# Patient Record
Sex: Male | Born: 1995 | Hispanic: Yes | Marital: Single | Smoking: Current some day smoker
Health system: Southern US, Community
[De-identification: ages and names within clinical notes are randomized; demographics above are authoritative.]

---

## 2018-12-19 ENCOUNTER — Emergency Department (HOSPITAL_COMMUNITY): Payer: Self-pay

## 2018-12-19 ENCOUNTER — Other Ambulatory Visit: Payer: Self-pay

## 2018-12-19 ENCOUNTER — Encounter (HOSPITAL_COMMUNITY): Payer: Self-pay | Admitting: Emergency Medicine

## 2018-12-19 ENCOUNTER — Emergency Department (HOSPITAL_COMMUNITY)
Admission: EM | Admit: 2018-12-19 | Discharge: 2018-12-19 | Disposition: A | Discharge status: Home / Self Care | Payer: Self-pay | Attending: Emergency Medicine | Admitting: Emergency Medicine

## 2018-12-19 DIAGNOSIS — J029 Acute pharyngitis, unspecified: Secondary | ICD-10-CM | POA: Insufficient documentation

## 2018-12-19 DIAGNOSIS — R509 Fever, unspecified: Secondary | ICD-10-CM | POA: Insufficient documentation

## 2018-12-19 DIAGNOSIS — B349 Viral infection, unspecified: Secondary | ICD-10-CM | POA: Insufficient documentation

## 2018-12-19 LAB — CBC WITH DIFFERENTIAL/PLATELET
Abs Immature Granulocytes: 0.03 10*3/uL (ref 0.00–0.07)
Basophils Absolute: 0 10*3/uL (ref 0.0–0.1)
Basophils Relative: 0 %
Eosinophils Absolute: 0 10*3/uL (ref 0.0–0.5)
Eosinophils Relative: 0 %
HCT: 43.2 % (ref 39.0–52.0)
Hemoglobin: 14.7 g/dL (ref 13.0–17.0)
Immature Granulocytes: 0 %
Lymphocytes Relative: 15 %
Lymphs Abs: 1.2 10*3/uL (ref 0.7–4.0)
MCH: 30.2 pg (ref 26.0–34.0)
MCHC: 34 g/dL (ref 30.0–36.0)
MCV: 88.7 fL (ref 80.0–100.0)
Monocytes Absolute: 1 10*3/uL (ref 0.1–1.0)
Monocytes Relative: 12 %
Neutro Abs: 6.1 10*3/uL (ref 1.7–7.7)
Neutrophils Relative %: 73 %
Platelets: 122 10*3/uL — ABNORMAL LOW (ref 150–400)
RBC: 4.87 MIL/uL (ref 4.22–5.81)
RDW: 12.4 % (ref 11.5–15.5)
WBC: 8.3 10*3/uL (ref 4.0–10.5)
nRBC: 0 % (ref 0.0–0.2)

## 2018-12-19 LAB — COMPREHENSIVE METABOLIC PANEL
ALT: 31 U/L (ref 0–44)
AST: 26 U/L (ref 15–41)
Albumin: 4 g/dL (ref 3.5–5.0)
Alkaline Phosphatase: 59 U/L (ref 38–126)
Anion gap: 13 (ref 5–15)
BUN: 8 mg/dL (ref 6–20)
CO2: 25 mmol/L (ref 22–32)
Calcium: 9.4 mg/dL (ref 8.9–10.3)
Chloride: 98 mmol/L (ref 98–111)
Creatinine, Ser: 1.12 mg/dL (ref 0.61–1.24)
GFR calc Af Amer: >60 mL/min (ref >60)
GFR calc non Af Amer: >60 mL/min (ref >60)
Glucose, Bld: 117 mg/dL — ABNORMAL HIGH (ref 70–99)
Potassium: 3.7 mmol/L (ref 3.5–5.1)
Sodium: 136 mmol/L (ref 135–145)
Total Bilirubin: 0.6 mg/dL (ref 0.3–1.2)
Total Protein: 7.6 g/dL (ref 6.5–8.1)

## 2018-12-19 LAB — URINALYSIS, ROUTINE W REFLEX MICROSCOPIC
Bilirubin Urine: NEGATIVE
Glucose, UA: NEGATIVE mg/dL
Hgb urine dipstick: NEGATIVE
Ketones, ur: NEGATIVE mg/dL
Leukocytes,Ua: NEGATIVE
Nitrite: NEGATIVE
Protein, ur: NEGATIVE mg/dL
Specific Gravity, Urine: 1.021 (ref 1.005–1.030)
pH: 7 (ref 5.0–8.0)

## 2018-12-19 LAB — GROUP A STREP BY PCR: Group A Strep by PCR: NOT DETECTED

## 2018-12-19 LAB — MONONUCLEOSIS SCREEN: Mono Screen: NEGATIVE

## 2018-12-19 MED ORDER — ACETAMINOPHEN 500 MG PO TABS
1000.0000 mg | ORAL_TABLET | Freq: Once | ORAL | Status: AC
  Administered 2018-12-19: 1000 mg via ORAL
  Filled 2018-12-19: qty 2

## 2018-12-19 NOTE — ED Provider Notes (Signed)
[facility]   CSN: [id number] Arrival date & time: 12/19/18  1701    History   Chief Complaint Chief Complaint  Patient presents with  . Fever  . Emesis    HPI Lorenza EvangelistDavid Tolentino-Solis is a 23 y.o. male.     23 year old healthy male who presents with fever.  Patient states that he has had 3 days of fevers at home associated with a sore throat.  He has been taking Tylenol, last dose was around 8 AM.  He denies any associated cough, runny nose, ear pain, vomiting, diarrhea, shortness of breath, sick contacts, abdominal pain, urinary symptoms, recent travel.  No body aches.  He does note that 3 days ago he had work done on his right great toe for an ingrown toenail.  He was given some medication to put on it to prevent infection.  He states that it has been healing okay at home.  The history is provided by the patient. A language interpreter was used.  Fever  Associated symptoms: vomiting   Emesis  Associated symptoms: fever     History reviewed. No pertinent past medical history.  There are no active problems to display for this patient.   ** The histories are not reviewed yet. Please review them in the "History" navigator section and refresh this SmartLink.    PMH: None  PSH: none  Home Medications    Prior to Admission medications   Not on File  none  Family History No family history on file.  Non-contributory  Social History Social History   Tobacco Use  . Smoking status: Current Some Day Smoker    Packs/day: 0.50    Years: 5.00    Pack years: 2.50    Types: Cigarettes  . Smokeless tobacco: Never Used  Substance Use Topics  . Alcohol use: Yes    Alcohol/week: 2.0 standard drinks    Types: 2 Cans of beer per week  . Drug use: Never   Denies IVDU  Allergies   Patient has no known allergies.   Review of Systems Review of Systems  Constitutional: Positive for fever.  Gastrointestinal:  Positive for vomiting.   All other systems reviewed and are negative except that which was mentioned in HPI   Physical Exam Updated Vital Signs BP 131/69   Pulse 97   Temp 100.3 F (37.9 C) (Oral)   Resp 16   Ht 5' 7.72" (1.72 m)   Wt 85.9 kg   SpO2 97%   BMI 29.04 kg/m   Physical Exam Vitals signs and nursing note reviewed.  Constitutional:      General: He is not in acute distress.    Appearance: He is well-developed. He is not ill-appearing.  HENT:     Head: Normocephalic and atraumatic.     Mouth/Throat:     Mouth: Mucous membranes are moist.     Comments: Erythema posterior oropharynx without exudates or tonsillar swelling; uvula midline Eyes:     Conjunctiva/sclera: Conjunctivae normal.  Neck:     Musculoskeletal: Neck supple.  Pulmonary:     Effort: Pulmonary effort is normal.  Abdominal:     General: There is no distension.     Palpations: Abdomen is soft.     Tenderness: There is no abdominal tenderness.  Musculoskeletal:        General: No swelling.     Right lower leg: No edema.  Skin:    General: Skin is warm and dry.  Comments: R great toenail with eschar on lateral side of nail, mildly tender, no surrounding erythema or drainage  Neurological:     Mental Status: He is alert and oriented to person, place, and time.     Comments: Fluent speech  Psychiatric:        Judgment: Judgment normal.      ED Treatments / Results  Labs (all labs ordered are listed, but only abnormal results are displayed) Labs Reviewed  COMPREHENSIVE METABOLIC PANEL - Abnormal; Notable for the following components:      Result Value   Glucose, Bld 117 (*)    All other components within normal limits  CBC WITH DIFFERENTIAL/PLATELET - Abnormal; Notable for the following components:   Platelets 122 (*)    All other components within normal limits  GROUP A STREP BY PCR  MONONUCLEOSIS SCREEN  URINALYSIS, ROUTINE W REFLEX MICROSCOPIC    EKG None  Radiology Dg  Chest Port 1 View  Result Date: 12/19/2018 CLINICAL DATA:  Fever. EXAM: PORTABLE CHEST 1 VIEW COMPARISON:  None. FINDINGS: The heart size and mediastinal contours are within normal limits. Both lungs are clear. The visualized skeletal structures are unremarkable. IMPRESSION: No active disease. Electronically Signed   By: Lupita Raider, M.D.   On: 12/19/2018 19:01    Procedures Procedures (including critical care time)  Medications Ordered in ED Medications  acetaminophen (TYLENOL) tablet 1,000 mg (1,000 mg Oral Given 12/19/18 1823)     Initial Impression / Assessment and Plan / ED Course  I have reviewed the triage vital signs and the nursing notes.  Pertinent labs & imaging results that were available during my care of the patient were reviewed by me and considered in my medical decision making (see chart for details).       Well appearing on exam, T100.3, HR 108, reassuring BP and normal O2 sat. No complaints of pain aside from sore throat. Toe does not appear to be acutely infected as source of fever.  His lab work today shows normal CMP, CBC with normal WBC count, mildly low platelets at 122, normal UA.  Strep and mono tests are negative.  Chest x-ray is clear.  He remains well-appearing with reassuring vital signs on reassessment.  He has denied any breathing problems and his O2 saturation is normal.  Through interpreter, I discussed the possibility of COVID-19 based on fever and symptoms.  Emphasized the importance of strict quarantine at home until symptoms resolve completely and I emphasized the importance of seeking immediate medical attention if he has any breathing or swallowing problems.  He voiced understanding.   Lawerance Tolentino-Solis was evaluated in Emergency Department on 12/19/2018 for the symptoms described in the history of present illness. He was evaluated in the context of the global COVID-19 pandemic, which necessitated consideration that the patient might be at risk  for infection with the SARS-CoV-2 virus that causes COVID-19. Institutional protocols and algorithms that pertain to the evaluation of patients at risk for COVID-19 are in a state of rapid change based on information released by regulatory bodies including the CDC and federal and state organizations. These policies and algorithms were followed during the patient's care in the ED.   Final Clinical Impressions(s) / ED Diagnoses   Final diagnoses:  Fever, unspecified fever cause  Sore throat  Viral syndrome    ED Discharge Orders    None       Niurka Benecke, Ambrose [place], MD 12/19/18 2149

## 2018-12-19 NOTE — ED Triage Notes (Addendum)
Patient arrived from home with reports of fever, sore throat X 2 days. Denies nausea and vomiting (took Tylenol 500 mg at 8am) Denies being around anyone who has been sick. Denies feeling short of breath, denies dizzy. Denies body aches Temp on arrival 100.2  Patient has wound on his right big toe. He reports he has been having fever on and off for 3 days. He reports that his toe nail was removed

## 2018-12-19 NOTE — Discharge Instructions (Addendum)
Síndrome viral y el Nuevo Coronavirus 2019 (COVID-19)  ° °Usted tiene una enfermedad viral que puede tener síntomas como dolores musculares, fiebres, escalofríos, secreción nasal, tos, estornudos, dolor de garganta, vómitos o diarrea. Uno de los virus que puede causar esto es el SARS-CoV-2, el virus que causa el COVID-19, también conocido como el Nuevo Coronavirus. También podría tener una infección viral diferente, como el resfriado común o la gripe (el flu). La mayoría de los pacientes con enfermedad viral, incluyendo COVID-19, tienen síntomas leves y se recuperan por sí solos. Descansar, mantenerse hidratado(a) y dormir son útiles. El día de hoy creemos que usted está lo suficientemente bien como para ir a casa y tratar sus síntomas con líquidos orales, y medicamentos para las fiebres, la tos y el dolor. ° °Por lo general, no hacemos la prueba para COVID-19 en personas con síntomas leves que están siendo dadas de alta del Departamento de Emergencias o de la Clínica.  ° °Si hicimos una prueba para COVID-19, los resultados no estarán disponibles por varios días. Nosotros le llamaremos con el resultado. Por favor, NO SE COMUNIQUE CON EL DEPARTAMENTO DE EMERGENCIAS O LA CLÍNICA PARA OBTENER LOS RESULTADOS DE ESTA PRUEBA.  ° °Por favor, siga las precauciones a continuación:  ° Quédese en casa durante al menos 7 días después del comienzo de sus síntomas O durante 3 días después de que termine su fiebre, el que tome más tiempo. ? ° ° Si otras personas viven con usted, ellos también deben quedarse en casa y evitar el contacto con otras personas durante 14 días.? ° ° °GUÍA DE AISLAMIENTO PARA UN POSIBLE COVID °La mayoría de las personas con tos y fiebre tienen una enfermedad causada por un virus. Uno es el COVID-19. No se les realiza la prueba de detección del virus que causa el COVID-19 a todas las personas con estas infecciones. Las personas que podrían tener COVID pero a quienes no se les está haciendo la prueba deben  tratar de prevenir la propagación de la infección de todos modos. ° °Estas instrucciones son recomendaciones modificadas del Departamento de Salud y [facility] de [place]. ° °Las personas que puedan tener el COVID-19 deben seguir las instrucciones que se indican a continuación hasta que un médico o departamento de salud les indique que pueden dejar de hacerlo. ° °Quédese en casa, a menos que necesite ver a un médico °Deje de hacer cosas fuera de su hogar, excepto para obtener atención médica. No vaya al trabajo, a la escuela o a las áreas públicas y no use transporte público ni taxis. ° °Llame antes de ir al médico °Antes de su cita, llame al consultorio de su médico e infórmeles sobre sus síntomas. Esto les ayudará a tomar medidas para evitar que otras personas se infecten.  ° °Vigile sus síntomas °Los síntomas son las cosas que usted siente, como la fiebre o la dificultad para respirar. Vaya con su médico o a la Sala de Emergencias si cree que está empeorando, como si tiene más dificultad para respirar. Llame al consultorio del médico e infórmeles sobre sus síntomas. Esto les ayudará a tomar medidas para evitar que otras personas se enfermen.  ° °Use una mascarilla facial °Debe usar una mascarilla facial que cubra su nariz y su boca cuando esté en la misma habitación con otras personas y cuando vaya al consultorio del médico. Las personas que viven con usted o que le visitan también deben usar una mascarilla facial cuando estén en la misma habitación que usted. ° °  Sepárese de otras personas en su hogar °Usted debe permanecer en una habitación diferente a la de otras personas en su hogar. Debe mantenerse separado(a) de los miembros de su familia lo más posible. Debe usar un baño diferente si puede. ° °Evite compartir los artículos en su hogar °No comparta platos, vasos, tazas, utensilios para comer, toallas, ropa de cama u otros artículos con las personas en su hogar. Después de usar estos  artículos, por favor lávelos muy bien con agua y jabón. ° °Cúbrase al toser y estornudar °Cubra su boca y nariz con un pañuelo de papel al toser o estornudar, o puede toser o estornudar en su manga. Tire los pañuelos de papel usados en un basurero que tenga una bolsa puesta y lávese inmediatamente las manos con agua y jabón durante al menos 20 segundos. Si usa un desinfectante para manos a base de alcohol, por favor frótese las manos durante 20 segundos. ° °Lávese las manos °Lávese muy bien las manos con frecuencia con agua y jabón durante al menos 20 segundos. Si sus manos no están visiblemente sucias, puede usar un desinfectante para manos a base de alcohol. No se toque los ojos, la nariz o la boca con las manos sin lavar. ° ° °Instrucciones para personas que ayudan a cuidar a pacientes con posible COVID-19 °Siga las instrucciones de su médico °Asegúrese de que usted comprenda y que pueda ayudar al/a la paciente a seguir las instrucciones de cuidado. ° °Proporcione las necesidades básicas del/de la paciente °Debe ayudar al/a la paciente con las necesidades básicas en el hogar y proveer apoyo para obtener los alimentos, las recetas médicas y otras necesidades personales. ° °Vigile los síntomas del/de la paciente °Si se están enfermando, llame a su médico. Esto ayudará a que el consultorio médico tome medidas para evitar que otras personas se infecten. ° °Limite el número de personas que tienen contacto con el/la paciente ° Si es posible, tenga un(a) solo(a) cuidador(a) para el/la paciente. ° Otros miembros de la familia deben quedarse en otra casa o lugar de residencia. Si no pueden, deben permanecer en otra habitación y mantenerse separados del/de la paciente lo más posible. ° Mantenga un baño SOLO para el/la paciente si puede.  ° Solo permita los visitantes si TIENEN QUE estar en el hogar. ° °Mantenga a los adultos mayores, a los niños muy pequeños y a otras personas enfermas lejos del/de la paciente °Mantenga a  los adultos mayores, niños muy pequeños y personas que tienen sistemas inmunológicos comprometidos o condiciones médicas crónicas alejados del/de la paciente. Esto incluye a las personas con enfermedades cardíacas, pulmonares o renales crónicas, diabetes y cáncer. ° °Lávese las manos con frecuencia ° Evite tocarse los ojos, la nariz y la boca con las manos sin lavar. ° Lávese muy bien las manos con frecuencia con agua y jabón durante al menos 20 segundos. Puede usar un desinfectante para manos a base de alcohol si no hay agua y jabón disponibles y si sus manos no están visiblemente sucias.  ° Use toallas de papel desechables para secarse las manos. Si no están disponibles, utilice las toallas de tela asignadas y sustitúyalas cuando se mojen. ° °Evite la contaminación de las mascarillas y guantes ° Use una mascarilla facial y guantes desechables cuando esté tocando al/a la paciente, las cosas en su habitación o del baño, o cosas que puedan tener sus fluidos corporales, como la ropa de cama o los platos, o sangre, vómito, orina o heces (popó).  ° Asegúrese de que la   mascarilla se ajuste bien sobre su nariz y su boca y no la toque en lo absoluto mientras la lleve puesta. ° Deseche las mascarillas y los guantes desechables después de usarlos. No vuelva a usarlos. ° Lávese las manos inmediatamente después de quitarse la mascarilla y los guantes. ° Si su ropa personal se ensucia con los fluidos corporales del/de la paciente, quítese la ropa cuidadosamente y lávela. Lávese las manos después de tocar la ropa sucia. ° Coloque todas las mascarillas y guantes desechables usados y otros desechos en un contenedor con bolsa antes de desecharse con otra basura del hogar.  ° Quítese los guantes y lávese las manos inmediatamente después de tocar estos artículos. ° °No comparta platos, vasos u otros artículos domésticos con el/la paciente ° Evite compartir los artículos del hogar. No debe compartir platos, vasos, tazas, utensilios  para comer, toallas, ropa de cama u otros artículos con un paciente que se confirme que tiene o que está siendo evaluado(a) para la infección por COVID-19.  ° Después de que la persona use estos artículos, debe lavarlos muy bien con agua y jabón. ° °Lave bien la ropa ° Quítese y lave inmediatamente la ropa o ropa de cama que tenga sangre, fluidos corporales y/o secreciones o excreciones, como sudor, saliva, esputo, moco nasal, vómito, orina o heces.  ° Use guantes cuando toque la ropa del/de la paciente.  ° Lea y siga las instrucciones que aparecen en las etiquetas de los artículos de lavandería o prendas de vestir y del detergente. En general, lave y seque con las temperaturas más calientes recomendadas en la etiqueta. ° °Limpie todas las áreas que la persona haya usado  ° Limpie todas las superficies de contacto, como mostradores, mesas, perillas de puerta, accesorios de baño, inodoros, teléfonos, teclados, tabletas y mesitas de noche todos los días. También, limpie cualquier superficie que pueda tener sangre, fluidos corporales y/o secreciones o excreciones sobre ella. Use guantes cuando limpie las superficies con las que el/la paciente haya estado en contacto.  ° Use una solución de blanqueador con cloro diluida (diluya blanqueador con cloro con 1 parte de blanqueador con cloro y 10 partes de agua) o un desinfectante doméstico con una etiqueta que indique que está registrado por la EPA para coronavirus. Para preparar una solución de blanqueador con cloro en casa, agregue 1 cucharada de blanqueador con cloro a 1 cuarto de galón (4 tazas) de agua. Para un suministro más grande, añada ¼ de taza de blanqueador con cloro a 1 galón (16 tazas) de agua.  ° Lea las etiquetas de los productos de limpieza y siga las recomendaciones proporcionadas en las etiquetas de los productos. Las etiquetas contienen instrucciones para el uso seguro y efectivo del producto de limpieza, incluyendo las precauciones que debe tomar al  aplicar el producto, como usar guantes o protección para los ojos, y asegurarse de tener una buena ventilación durante el uso del producto.  ° Quítese los guantes y lávese las manos inmediatamente después de limpiar. ° °Vigílese a sí mismo(a) para la detección de signos y síntomas de enfermedad ° Los cuidadores y los miembros del hogar se consideran contactos cercanos, deben vigilar su salud y se les pedirá que limiten el movimiento fuera del hogar lo más posible.  ° °Si tiene preguntas adicionales: °Contacte a:  ° Su médico o proveedor(a) de atención médica ° Su departamento de salud local  ° °Esta guía está sujeta a cambios. Para obtener la guía más actualizada, visite el sitio web del Departamento de Salud   del estado en [email or web address]  °

## 2018-12-19 NOTE — ED Notes (Signed)
This NT attempted to let him try to use the urinal and pt had no urine output at this time.

## 2018-12-20 ENCOUNTER — Other Ambulatory Visit: Payer: Self-pay

## 2018-12-20 ENCOUNTER — Emergency Department (HOSPITAL_COMMUNITY)
Admission: EM | Admit: 2018-12-20 | Discharge: 2018-12-21 | Disposition: A | Discharge status: Home / Self Care | Payer: Self-pay | Attending: Emergency Medicine | Admitting: Emergency Medicine

## 2018-12-20 ENCOUNTER — Encounter (HOSPITAL_COMMUNITY): Payer: Self-pay | Admitting: Emergency Medicine

## 2018-12-20 DIAGNOSIS — K0500 Acute gingivitis, plaque induced: Secondary | ICD-10-CM | POA: Insufficient documentation

## 2018-12-20 DIAGNOSIS — F17210 Nicotine dependence, cigarettes, uncomplicated: Secondary | ICD-10-CM | POA: Insufficient documentation

## 2018-12-20 DIAGNOSIS — K0510 Chronic gingivitis, plaque induced: Secondary | ICD-10-CM

## 2018-12-20 MED ORDER — CHLORHEXIDINE GLUCONATE 0.12% ORAL RINSE (MEDLINE KIT): 10.0000 mL | Freq: Two times a day (BID) | OROMUCOSAL | 0 refills | Status: AC

## 2018-12-20 MED ORDER — CLINDAMYCIN HCL 150 MG PO CAPS: 450.0000 mg | ORAL_CAPSULE | Freq: Three times a day (TID) | ORAL | 0 refills | Status: AC

## 2018-12-20 NOTE — Discharge Instructions (Signed)
You presented to the ED for pain in your gums, mouth, throat You have severely inflamed gums.  This is causing pain with eating.  Brush and floss your teeth twice a day.  Use warm water with salt gargles to help with inflammation. Use chlorhexidine solution to rinse mouth twice a day after brushing your teeth.  Take [phone] mg acetaminophen every 6 hours.  You can add ibuprofen 600 mg every 6 hours for more pain control. Take clindamycin to prevent infection.   Eat soft foods and a lot of liquids.  Call dentist for routine dental care.  Return for worsening gum swelling, facial swelling, fever greater than 100.4  Se present a la sala de emergencia por dolor en las encas, la boca y Administrator. Tiene encas severamente inflamadas. Esto est causando dolor al comer.   Cepillese y use hilo dental en los [employer]  veces al da. Use agua tibia con sal para hacer grgaras para ayudar con la inflamacin dos veces al dia. Use la solucin de clorhexidina para enjuagarse la boca dos veces al da despus de cepillarse los dientes.  Tome [phone] mg de acetaminofeno (tylenol) cada 6 horas. Puede agregar ibuprofeno 600 mg cada 6 horas para un mas control del dolor. Tome clindamicina (antibiotico) para prevenir la infeccin.   Coma alimentos blandos y muchos lquidos.  Llame al dentista para recibir atencin dental de rutina.  Regrese por empeoramiento de la inflamacin de las encas, hinchazn facial, fiebre mayor de 100.4

## 2018-12-20 NOTE — ED Triage Notes (Signed)
Pt returning to ER with complaints of vomiting, fever, and constipation. States he was discharged yesterday but has began feeling worse. Pt afebrile

## 2018-12-20 NOTE — ED Provider Notes (Signed)
[facility] EMERGENCY DEPARTMENT Provider Note   CSN: [id number] Arrival date & time: 12/20/18  2203    History   Chief Complaint No chief complaint on file.   HPI Marc Warren is a 23 y.o. male is here for evaluation of pain in his throat and gums.  Pt is spanish speaking only.  I offered formal spanish video interpreter but patient was comfortable speaking to me directly in [place].  Patient reports having pain in his gums and in his mouth with any eating and drinking.  Has noticed his gums are swollen and red and ooze blood when he tries to lick them or tries to eat/drink.  Symptoms began 3 days ago.  States he can barely eat or drink due to the pain in his mouth.  No frank vomiting but states everything tastes bad so he will spit it back up.  Has had subjective fevers but has not checked his temperature.  In the last 24 hours he has urinated once but states he has not been able to drink a lot of fluids due to the mouth pain.  He only drank 3 cups of juice today.  He has not eaten today.  Due to the mouth pain he has had decreased oral fluid and food intake in the last 3 days.  He was able to tolerate some Pedialyte yesterday.  Has not had a BM in 3 days but states he has not eaten anything and attributes it to lack of food.  Still passing gas normally.  Patient was seen in the ED for nausea, vomiting and fever just 24 hours ago.  He had labs, urinalysis, rapid strep swab and mono testing done which was normal.  Has been taken Tylenol at home with transient relief in the pain in his mouth.  He denies any recent travel.  He denies any exposure to sick contacts.  He denies exposure to suspected or confirmed COVID-19.  No associated headaches, chest pain, shortness of breath, cough, congestion, trismus, muffled voice, body aches, abdominal pain, dysuria, diarrhea. HPI  History reviewed. No pertinent past medical history.  There are no active problems to display for  this patient.   ** The histories are not reviewed yet. Please review them in the "History" navigator section and refresh this [place].      Home Medications    Prior to Admission medications   Medication Sig Start Date End Date Taking? Authorizing Provider  chlorhexidine gluconate, MEDLINE KIT, (PERIDEX) 0.12 % solution Use as directed 10 mLs in the mouth or throat 2 (two) times daily for 6 days. 12/20/18 12/26/18  Kinnie Feil, PA-C  clindamycin (CLEOCIN) 150 MG capsule Take 3 capsules (450 mg total) by mouth 3 (three) times daily for 7 days. 12/20/18 12/27/18  Kinnie Feil, PA-C    Family History No family history on file.  Social History Social History   Tobacco Use   Smoking status: Current Some Day Smoker    Packs/day: 0.50    Years: 5.00    Pack years: 2.50    Types: Cigarettes   Smokeless tobacco: Never Used  Substance Use Topics   Alcohol use: Yes    Alcohol/week: 2.0 standard drinks    Types: 2 Cans of beer per week   Drug use: Never     Allergies   Patient has no known allergies.   Review of Systems Review of Systems  Constitutional: Positive for fever (Subjective).  HENT:  Mouth/gum pain and swelling and bleeding  All other systems reviewed and are negative.    Physical Exam Updated Vital Signs BP 138/86    Pulse 85    Temp 98.5 F (36.9 C) (Oral)    SpO2 100%   Physical Exam Vitals signs and nursing note reviewed.  Constitutional:      Appearance: He is well-developed.     Comments: Nontoxic, pleasant  HENT:     Head: Normocephalic and atraumatic.     Nose: Nose normal.     Mouth/Throat:     Dentition: Abnormal dentition. Gingival swelling present.     Comments: Markedly edematous, erythematous, friable and tender gingiva diffusely to upper and lower dentition much worse in top gumline.  Slight erythema to oropharynx. Tonsils normal without edema, exudates, petechiae. Uvula midline. No trismus. No SL fullness. No muffled  voice. Tolerating secretions.  Eyes:     Conjunctiva/sclera: Conjunctivae normal.  Neck:     Musculoskeletal: Normal range of motion.     Comments: Mildly tender and enlarged, symmetric submandibular lymph nodes. Cardiovascular:     Rate and Rhythm: Normal rate and regular rhythm.     Heart sounds: Normal heart sounds.  Pulmonary:     Effort: Pulmonary effort is normal.     Breath sounds: Normal breath sounds.  Abdominal:     General: Bowel sounds are normal.     Palpations: Abdomen is soft.     Tenderness: There is no abdominal tenderness.     Comments: No G/R/R. No suprapubic or CVA tenderness. Negative Murphy's and McBurney's  Musculoskeletal: Normal range of motion.  Lymphadenopathy:     Cervical: Cervical adenopathy present.  Skin:    General: Skin is warm and dry.     Capillary Refill: Capillary refill takes less than 2 seconds.  Neurological:     Mental Status: He is alert.  Psychiatric:        Behavior: Behavior normal.      ED Treatments / Results  Labs (all labs ordered are listed, but only abnormal results are displayed) Labs Reviewed - No data to display  EKG None  Radiology Dg Chest [facility] 1 View  Result Date: 12/19/2018 CLINICAL DATA:  Fever. EXAM: PORTABLE CHEST 1 VIEW COMPARISON:  None. FINDINGS: The heart size and mediastinal contours are within normal limits. Both lungs are clear. The visualized skeletal structures are unremarkable. IMPRESSION: No active disease. Electronically Signed   By: Marijo Conception, M.D.   On: 12/19/2018 19:01    Procedures Procedures (including critical care time)  Medications Ordered in ED Medications - No data to display   Initial Impression / Assessment and Plan / ED Course  I have reviewed the triage vital signs and the nursing notes.  Pertinent labs & imaging results that were available during my care of the patient were reviewed by me and considered in my medical decision making (see chart for details).         History and exam consistent with advanced gingivitis versus early periodontal disease.  Patient has had minimal to no dental care.  He has exquisite diffuse gumline tenderness with friability.  However I do not see focal gumline or dental abscess to warrant I&D.  No significant abnormalities on throat exam. Given his subjective fevers, extensive gumline disease, lack of follow-up I feel he will benefit from clindamycin to cover for potential early dental infection.  Clindamycin will also cover potential pharyngitis/tonsillitis.  Will DC with chlorhexidine mouth solution.  Encouraged good  dental hygiene with flossing and brushing.  Encouraged dental follow-up.  Warm water/salt gargles and compresses to the gumline to help with inflammation, high-dose NSAIDs.  Soft foods.  Patient was seen in the ED 24 hours ago and had labs, strep pharyngitis swab, mono testing, UA which were reviewed by me.  These were unremarkable.  He has no frank nausea, vomiting or constipation but it seems he has had decreased PO intake due to mouth/gum pain, bleeding and bad taste in his mouth causing him to spit food.  Abd exam benign. Given overall benign exam today I do not feel there is indication for repeat emergent labs or imaging.  Return precautions were discussed.  Patient was comfortable with the plan.  Final Clinical Impressions(s) / ED Diagnoses   Final diagnoses:  Generalized gingivitis  Gum inflammation    ED Discharge Orders         Ordered    clindamycin (CLEOCIN) 150 MG capsule  3 times daily     12/20/18 2340    chlorhexidine gluconate, MEDLINE KIT, (PERIDEX) 0.12 % solution  2 times daily     12/20/18 2340           Arlean Hopping 12/21/18 Ainsley Spinner, MD 12/21/18 2136

## 2018-12-21 NOTE — ED Notes (Signed)
Patient verbalizes understanding of discharge instructions. Opportunity for questioning and answers were provided. Armband removed by staff, pt discharged from ED ambulatory.   

## 2019-08-19 IMAGING — CR PORTABLE CHEST - 1 VIEW
1 series · 1 of 1 positions shown · non-contrast
Comparison: None.

CLINICAL DATA: Fever.

EXAM:
PORTABLE CHEST 1 VIEW

[AP]
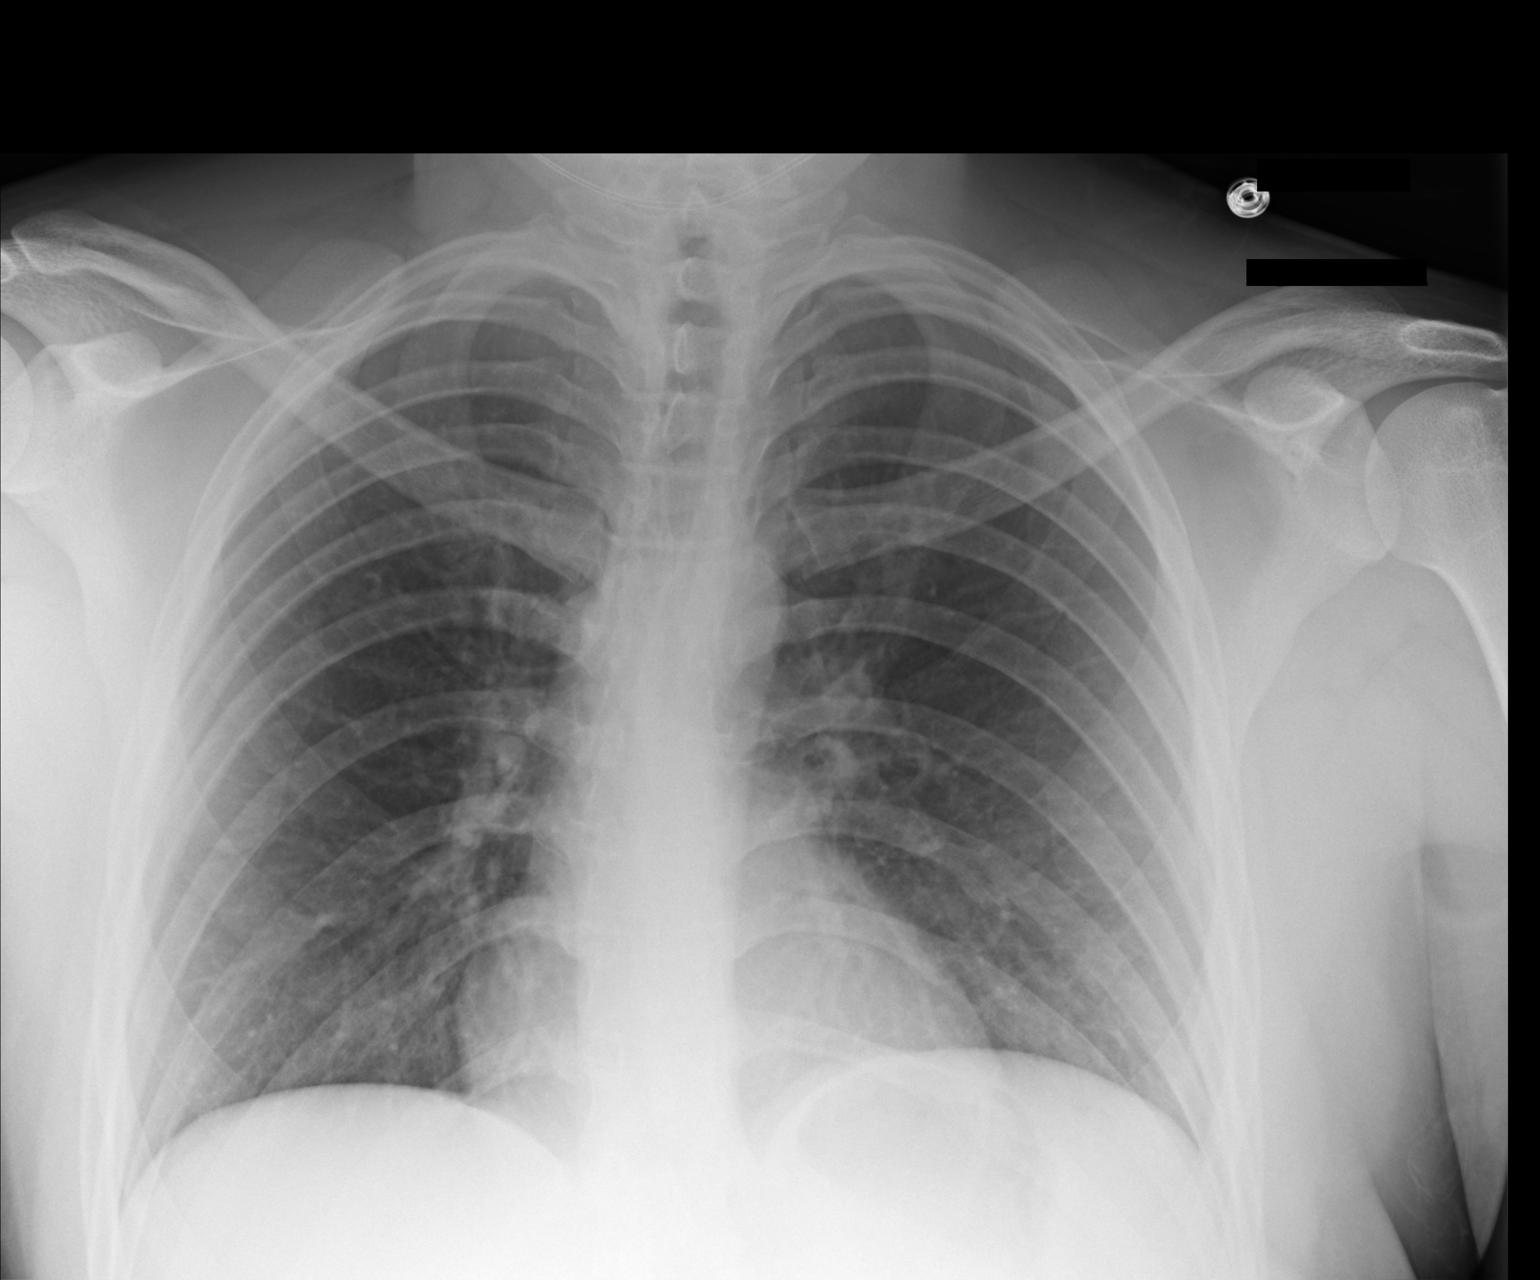

[1 of 1 positions shown; findings below may reference images not displayed]

FINDINGS: The heart size and mediastinal contours are within normal limits.
Both lungs are clear. The visualized skeletal structures are
unremarkable.
IMPRESSION: No active disease.

## 2020-10-17 ENCOUNTER — Encounter (HOSPITAL_COMMUNITY): Payer: Self-pay

## 2020-10-17 ENCOUNTER — Emergency Department (HOSPITAL_COMMUNITY)
Admission: EM | Admit: 2020-10-17 | Discharge: 2020-10-18 | Disposition: A | Discharge status: Home / Self Care | Payer: Self-pay | Attending: Emergency Medicine | Admitting: Emergency Medicine

## 2020-10-17 DIAGNOSIS — R519 Headache, unspecified: Secondary | ICD-10-CM | POA: Insufficient documentation

## 2020-10-17 DIAGNOSIS — R059 Cough, unspecified: Secondary | ICD-10-CM | POA: Insufficient documentation

## 2020-10-17 DIAGNOSIS — Z5321 Procedure and treatment not carried out due to patient leaving prior to being seen by health care provider: Secondary | ICD-10-CM | POA: Insufficient documentation

## 2020-10-17 DIAGNOSIS — Z20822 Contact with and (suspected) exposure to covid-19: Secondary | ICD-10-CM | POA: Insufficient documentation

## 2020-10-17 NOTE — ED Triage Notes (Signed)
Pt comes for headache, cough and flu like symptoms that has been going on for 3 days, unvaccinated

## 2020-10-18 LAB — SARS CORONAVIRUS 2 (TAT 6-24 HRS): SARS Coronavirus 2: NEGATIVE

## 2020-10-18 NOTE — ED Notes (Signed)
Pt states they are leaving  

## 2021-05-26 ENCOUNTER — Emergency Department (HOSPITAL_COMMUNITY)
Admission: EM | Admit: 2021-05-26 | Discharge: 2021-05-27 | Disposition: A | Discharge status: Home / Self Care | Payer: Self-pay | Attending: Emergency Medicine | Admitting: Emergency Medicine

## 2021-05-26 ENCOUNTER — Encounter (HOSPITAL_COMMUNITY): Payer: Self-pay | Admitting: Emergency Medicine

## 2021-05-26 DIAGNOSIS — L02214 Cutaneous abscess of groin: Secondary | ICD-10-CM | POA: Insufficient documentation

## 2021-05-26 DIAGNOSIS — F17210 Nicotine dependence, cigarettes, uncomplicated: Secondary | ICD-10-CM | POA: Insufficient documentation

## 2021-05-26 LAB — URINALYSIS, ROUTINE W REFLEX MICROSCOPIC
Bilirubin Urine: NEGATIVE
Glucose, UA: NEGATIVE mg/dL
Hgb urine dipstick: NEGATIVE
Ketones, ur: NEGATIVE mg/dL
Leukocytes,Ua: NEGATIVE
Nitrite: NEGATIVE
Protein, ur: NEGATIVE mg/dL
Specific Gravity, Urine: 1.025 (ref 1.005–1.030)
pH: 6 (ref 5.0–8.0)

## 2021-05-26 MED ORDER — LIDOCAINE-EPINEPHRINE 2 %-1:100000 IJ SOLN
20.0000 mL | Freq: Once | INTRAMUSCULAR | Status: AC
  Administered 2021-05-26: 20 mL
  Filled 2021-05-26: qty 1

## 2021-05-26 NOTE — ED Triage Notes (Signed)
Pt c/o testicle pain with mass x1 week. Patient is spanish speaking.  Translator Uriel [phone] used to complete triage.

## 2021-05-26 NOTE — Discharge Instructions (Addendum)
Remove the packing in 3 days or return if symptoms are worsening.  If the packing falls out on its own it does not need to be replaced.

## 2021-05-26 NOTE — ED Provider Notes (Signed)
[facility]-EMERGENCY DEPT Provider Note: Lowella Dell, MD, FACEP  CSN: [id number] MRN: [id number] ARRIVAL: 05/26/21 at 2241 ROOM: WA08/WA08   CHIEF COMPLAINT  Testicle Pain   HISTORY OF PRESENT ILLNESS  05/26/21 11:21 PM Marc Warren is a 25 y.o. male who has noticed a tender mass which she states is on his right testicle.  It is been there for the past 5 days.  He rates associated pain as an 8 out of 10, worse with walking.  He has not had any abdominal pain, fever, chills, dysuria or urethral discharge.  He denies any scrotal injury.   History reviewed. No pertinent past medical history.  History reviewed. No pertinent surgical history.  No family history on file.  Social History   Tobacco Use   Smoking status: Some Days    Packs/day: 0.50    Years: 5.00    Pack years: 2.50    Types: Cigarettes   Smokeless tobacco: Never  Vaping Use   Vaping Use: Never used  Substance Use Topics   Alcohol use: Yes    Alcohol/week: 2.0 standard drinks    Types: 2 Cans of beer per week   Drug use: Never    Prior to Admission medications   Not on File    Allergies Patient has no known allergies.   REVIEW OF SYSTEMS  Negative except as noted here or in the History of Present Illness.   PHYSICAL EXAMINATION  Initial Vital Signs Blood pressure (!) 126/91, pulse 70, temperature 98.2 F (36.8 C), resp. rate 18, SpO2 99 %.  Examination General: Well-developed, well-nourished male in no acute distress; appearance consistent with age of record HENT: normocephalic; atraumatic Eyes: pupils equal, round and reactive to light; extraocular muscles intact Neck: supple Heart: regular rate and rhythm Lungs: clear to auscultation bilaterally Abdomen: soft; nondistended; nontender; bowel sounds present GU: Tanner V male, uncircumcised; no urethral discharge; no testicular tenderness or mass; no hernia palpated Extremities: No deformity; full range of motion; pulses  normal Neurologic: Awake, alert and oriented; motor function intact in all extremities and symmetric; no facial droop Skin: Warm and dry; tender, fluctuant mass of the right groin fold Psychiatric: Normal mood and affect   RESULTS  Summary of this visit's results, reviewed and interpreted by myself:   EKG Interpretation  Date/Time:    Ventricular Rate:    PR Interval:    QRS Duration:   QT Interval:    QTC Calculation:   R Axis:     Text Interpretation:         Laboratory Studies: Results for orders placed or performed during the hospital encounter of 05/26/21 (from the past 24 hour(s))  Urinalysis, Routine w reflex microscopic     Status: Abnormal   Collection Time: 05/26/21 11:16 PM  Result Value Ref Range   Color, Urine YELLOW (A) YELLOW   APPearance CLEAR (A) CLEAR   Specific Gravity, Urine 1.025 1.005 - 1.030   pH 6.0 5.0 - 8.0   Glucose, UA NEGATIVE NEGATIVE mg/dL   Hgb urine dipstick NEGATIVE NEGATIVE   Bilirubin Urine NEGATIVE NEGATIVE   Ketones, ur NEGATIVE NEGATIVE mg/dL   Protein, ur NEGATIVE NEGATIVE mg/dL   Nitrite NEGATIVE NEGATIVE   Leukocytes,Ua NEGATIVE NEGATIVE   RBC / HPF 0-5 0 - 5 RBC/hpf   WBC, UA 0-5 0 - 5 WBC/hpf   Bacteria, UA RARE (A) NONE SEEN   Squamous Epithelial / LPF 0-5 0 - 5   Mucus PRESENT    Imaging Studies:  No results found.  ED COURSE and MDM  Nursing notes, initial and subsequent vitals signs, including pulse oximetry, reviewed and interpreted by myself.  Vitals:   05/26/21 2251  BP: (!) 126/91  Pulse: 70  Resp: 18  Temp: 98.2 F (36.8 C)  SpO2: 99%   Medications  lidocaine-EPINEPHrine (XYLOCAINE W/EPI) 2 %-1:100000 (with pres) injection 20 mL (20 mLs Infiltration Given 05/26/21 2348)   Examination consistent with a right groin fold abscess not involving the genitalia.   PROCEDURES  Procedures INCISION AND DRAINAGE Performed by: Carlisle Beers Barett Whidbee Consent: Verbal consent obtained. Risks and benefits: risks, benefits  and alternatives were discussed Type: abscess  Body area: Right groin fold  Anesthesia: local infiltration  Incision was made with a scalpel.  Local anesthetic: lidocaine 2% with epinephrine  Anesthetic total: 4 ml  Complexity: complex Blunt dissection to break up loculations  Drainage: purulent  Drainage amount: Moderate  Packing material: 1/4 in iodoform gauze  Patient tolerance: Patient tolerated the procedure well with no immediate complications.   ED DIAGNOSES     ICD-10-CM   1. Abscess of right groin  L02.214          Paula Libra, MD 05/26/21 2351

## 2021-05-28 LAB — GC/CHLAMYDIA PROBE AMP (~~LOC~~) NOT AT ARMC
Chlamydia: NEGATIVE
Comment: NEGATIVE
Comment: NORMAL
Neisseria Gonorrhea: NEGATIVE

## 2022-06-02 ENCOUNTER — Emergency Department (HOSPITAL_COMMUNITY)
Admission: EM | Admit: 2022-06-02 | Discharge: 2022-06-02 | Discharge status: Against Medical Advice | Payer: Self-pay | Attending: Emergency Medicine | Admitting: Emergency Medicine

## 2022-06-02 ENCOUNTER — Emergency Department (HOSPITAL_COMMUNITY): Payer: Self-pay

## 2022-06-02 DIAGNOSIS — R1030 Lower abdominal pain, unspecified: Secondary | ICD-10-CM | POA: Insufficient documentation

## 2022-06-02 DIAGNOSIS — Z5321 Procedure and treatment not carried out due to patient leaving prior to being seen by health care provider: Secondary | ICD-10-CM | POA: Insufficient documentation
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
